# Patient Record
Sex: Female | Born: 1945 | Race: Black or African American | Hispanic: No | Smoking: Never smoker
Health system: Southern US, Community
[De-identification: ages and names within clinical notes are randomized; demographics above are authoritative.]

## PROBLEM LIST (undated history)

## (undated) DIAGNOSIS — M1990 Unspecified osteoarthritis, unspecified site: Secondary | ICD-10-CM

## (undated) DIAGNOSIS — I10 Essential (primary) hypertension: Secondary | ICD-10-CM

## (undated) DIAGNOSIS — E7800 Pure hypercholesterolemia, unspecified: Secondary | ICD-10-CM

## (undated) HISTORY — PX: ABDOMINAL HYSTERECTOMY: SHX81

---

## 2005-04-04 ENCOUNTER — Ambulatory Visit: Payer: Self-pay | Admitting: Family Medicine

## 2006-04-10 ENCOUNTER — Ambulatory Visit: Payer: Self-pay | Admitting: Family Medicine

## 2006-05-19 ENCOUNTER — Ambulatory Visit: Payer: Self-pay | Admitting: Gastroenterology

## 2006-12-21 ENCOUNTER — Emergency Department: Payer: Self-pay | Admitting: Emergency Medicine

## 2007-05-24 ENCOUNTER — Ambulatory Visit: Payer: Self-pay | Admitting: Family Medicine

## 2008-01-06 ENCOUNTER — Emergency Department: Payer: Self-pay | Admitting: Emergency Medicine

## 2008-06-11 ENCOUNTER — Ambulatory Visit: Payer: Self-pay | Admitting: Family Medicine

## 2009-06-12 ENCOUNTER — Ambulatory Visit: Payer: Self-pay | Admitting: Family Medicine

## 2010-06-16 ENCOUNTER — Ambulatory Visit: Payer: Self-pay | Admitting: Internal Medicine

## 2011-07-06 ENCOUNTER — Ambulatory Visit: Payer: Self-pay | Admitting: Internal Medicine

## 2011-11-09 ENCOUNTER — Ambulatory Visit: Payer: Self-pay | Admitting: Gastroenterology

## 2012-07-06 ENCOUNTER — Ambulatory Visit: Payer: Self-pay | Admitting: Internal Medicine

## 2013-07-08 ENCOUNTER — Ambulatory Visit: Payer: Self-pay | Admitting: Internal Medicine

## 2014-07-23 ENCOUNTER — Ambulatory Visit: Payer: Self-pay | Admitting: Internal Medicine

## 2014-07-29 ENCOUNTER — Ambulatory Visit: Payer: Self-pay | Admitting: Internal Medicine

## 2015-01-27 ENCOUNTER — Ambulatory Visit
Admit: 2015-01-27 | Disposition: A | Discharge status: Home / Self Care | Payer: Self-pay | Attending: Internal Medicine | Admitting: Internal Medicine

## 2015-07-31 ENCOUNTER — Other Ambulatory Visit: Payer: Self-pay | Admitting: Internal Medicine

## 2015-07-31 DIAGNOSIS — R928 Other abnormal and inconclusive findings on diagnostic imaging of breast: Secondary | ICD-10-CM

## 2015-08-14 ENCOUNTER — Ambulatory Visit
Admission: RE | Admit: 2015-08-14 | Discharge: 2015-08-14 | Disposition: A | Discharge status: Home / Self Care | Payer: Medicare Other | Source: Ambulatory Visit | Attending: Internal Medicine | Admitting: Internal Medicine

## 2015-08-14 DIAGNOSIS — R928 Other abnormal and inconclusive findings on diagnostic imaging of breast: Secondary | ICD-10-CM | POA: Insufficient documentation

## 2016-06-14 ENCOUNTER — Emergency Department
Admission: EM | Admit: 2016-06-14 | Discharge: 2016-06-14 | Disposition: A | Discharge status: Home / Self Care | Payer: Medicare Other | Attending: Emergency Medicine | Admitting: Emergency Medicine

## 2016-06-14 DIAGNOSIS — S46911A Strain of unspecified muscle, fascia and tendon at shoulder and upper arm level, right arm, initial encounter: Secondary | ICD-10-CM | POA: Insufficient documentation

## 2016-06-14 DIAGNOSIS — Y929 Unspecified place or not applicable: Secondary | ICD-10-CM | POA: Insufficient documentation

## 2016-06-14 DIAGNOSIS — Y990 Civilian activity done for income or pay: Secondary | ICD-10-CM | POA: Diagnosis not present

## 2016-06-14 DIAGNOSIS — X501XXA Overexertion from prolonged static or awkward postures, initial encounter: Secondary | ICD-10-CM | POA: Diagnosis not present

## 2016-06-14 DIAGNOSIS — Y9389 Activity, other specified: Secondary | ICD-10-CM | POA: Diagnosis not present

## 2016-06-14 DIAGNOSIS — T148XXA Other injury of unspecified body region, initial encounter: Secondary | ICD-10-CM

## 2016-06-14 DIAGNOSIS — S4992XA Unspecified injury of left shoulder and upper arm, initial encounter: Secondary | ICD-10-CM | POA: Diagnosis present

## 2016-06-14 MED ORDER — CYCLOBENZAPRINE HCL 10 MG PO TABS: 5.0000 mg | ORAL_TABLET | Freq: Three times a day (TID) | ORAL | 0 refills | Status: DC | PRN

## 2016-06-14 MED ORDER — NAPROXEN 500 MG PO TABS: 500.0000 mg | ORAL_TABLET | Freq: Two times a day (BID) | ORAL | 0 refills | Status: AC

## 2016-06-14 NOTE — ED Notes (Signed)
Per workers comp profile no UDS nor Breath Analysis is required.

## 2016-06-14 NOTE — ED Triage Notes (Signed)
Pt was lifting bucket at work and felt sharp pain to right lower back are, states worse when she walks or moves. Pt does not want to file workmans comp at this time.

## 2016-06-14 NOTE — ED Provider Notes (Signed)
[facility] Emergency Department Provider Note ____________________________________________  Time seen: Approximately 7:44 PM  I have reviewed the triage vital signs and the nursing notes.   HISTORY  Chief Complaint Back Pain    HPI Jennifer Wagner is a 70 y.o. female who presents to the emergency department for evaluation of pain in her right shoulder and right side. She states that while at work, she lifted a bucket and felt a sharp pain in her shoulder and side. She states she has had this pain before and was given some muscle relaxers which helped relieve the pain.  No past medical history on file.  There are no active problems to display for this patient.   No past surgical history on file.  Prior to Admission medications   Medication Sig Start Date End Date Taking? Authorizing Provider  cyclobenzaprine (FLEXERIL) 10 MG tablet Take 0.5 tablets (5 mg total) by mouth 3 (three) times daily as needed for muscle spasms. 06/14/16   Victorino Dike, FNP  naproxen (NAPROSYN) 500 MG tablet Take 1 tablet (500 mg total) by mouth 2 (two) times daily with a meal. 06/14/16   Victorino Dike, FNP    Allergies Review of patient's allergies indicates no known allergies.  No family history on file.  Social History Social History  Substance Use Topics  . Smoking status: Not on file  . Smokeless tobacco: Not on file  . Alcohol use Not on file    Review of Systems Constitutional: No recent illness. Cardiovascular: Denies chest pain or palpitations. Respiratory: Denies shortness of breath. Musculoskeletal: Pain in Right shoulder and right side Skin: Negative for rash, wound, lesion. Neurological: Negative for focal weakness or numbness.  ____________________________________________   PHYSICAL EXAM:  VITAL SIGNS: ED Triage Vitals [06/14/16 1912]  Enc Vitals Group     BP (!) 175/89     Pulse Rate (!) 106     Resp 18     Temp 98.5 F (36.9 C)      Temp Source Oral     SpO2 99 %     Weight 140 lb (63.5 kg)     Height 5\' 6"  (1.676 m)     Head Circumference      Peak Flow      Pain Score 10     Pain Loc      Pain Edu?      Excl. in [place]?     Constitutional: Alert and oriented. Well appearing and in no acute distress. Eyes: Conjunctivae are normal. EOMI. Head: Atraumatic. Neck: No stridor.  Respiratory: Normal respiratory effort.   Musculoskeletal: Range of motion of all extremities, specifically the right shoulder. Pain in the lateral thoracic area increases with stretching. Neurologic:  Normal speech and language. No gross focal neurologic deficits are appreciated. Speech is normal. No gait instability. Skin:  Skin is warm, dry and intact. Atraumatic. Psychiatric: Mood and affect are normal. Speech and behavior are normal.  ____________________________________________   LABS (all labs ordered are listed, but only abnormal results are displayed)  Labs Reviewed - No data to display ____________________________________________  RADIOLOGY  Not indicated ____________________________________________   PROCEDURES  Procedure(s) performed: None   ____________________________________________   INITIAL IMPRESSION / ASSESSMENT AND PLAN / ED COURSE  Clinical Course    Pertinent labs & imaging results that were available during my care of the patient were reviewed by me and considered in my medical decision making (see chart for details).  Patient was advised to take  one half tablet of Flexeril and Naprosyn as prescribed. She was advised that the Flexeril may potentially cause dizziness. She reports having taken it recently without any adverse effect. She was encouraged to follow up with her primary care provider for symptoms that are not improving over the week. She was also advised to return to the emergency department for symptoms that change or worsen if she's unable to schedule an  appointment. ____________________________________________   FINAL CLINICAL IMPRESSION(S) / ED DIAGNOSES  Final diagnoses:  Muscle strain       Victorino Dike, FNP 06/14/16 2037    Nena Polio, MD 06/14/16 2237

## 2016-06-14 NOTE — ED Notes (Signed)
Pt wants to file workers comp for injury. Pt provided supervisor number, Mr. Lenard Simmer [phone]). Attempted to call [phone], no answer.

## 2016-07-28 ENCOUNTER — Other Ambulatory Visit: Payer: Self-pay | Admitting: Internal Medicine

## 2016-07-28 DIAGNOSIS — Z1231 Encounter for screening mammogram for malignant neoplasm of breast: Secondary | ICD-10-CM

## 2016-08-05 ENCOUNTER — Other Ambulatory Visit: Payer: Self-pay | Admitting: Internal Medicine

## 2016-08-05 DIAGNOSIS — Z13820 Encounter for screening for osteoporosis: Secondary | ICD-10-CM

## 2016-08-30 ENCOUNTER — Ambulatory Visit
Admission: RE | Admit: 2016-08-30 | Discharge: 2016-08-30 | Disposition: A | Discharge status: Home / Self Care | Payer: Medicare Other | Source: Ambulatory Visit | Attending: Internal Medicine | Admitting: Internal Medicine

## 2016-08-30 DIAGNOSIS — Z1231 Encounter for screening mammogram for malignant neoplasm of breast: Secondary | ICD-10-CM | POA: Diagnosis not present

## 2016-09-14 ENCOUNTER — Ambulatory Visit
Admission: RE | Admit: 2016-09-14 | Discharge: 2016-09-14 | Disposition: A | Discharge status: Home / Self Care | Payer: Medicare Other | Source: Ambulatory Visit | Attending: Internal Medicine | Admitting: Internal Medicine

## 2016-09-14 DIAGNOSIS — Z780 Asymptomatic menopausal state: Secondary | ICD-10-CM | POA: Diagnosis not present

## 2016-09-14 DIAGNOSIS — M85851 Other specified disorders of bone density and structure, right thigh: Secondary | ICD-10-CM | POA: Diagnosis not present

## 2016-09-14 DIAGNOSIS — Z13820 Encounter for screening for osteoporosis: Secondary | ICD-10-CM | POA: Insufficient documentation

## 2017-06-02 ENCOUNTER — Encounter: Payer: Self-pay | Admitting: *Deleted

## 2017-06-05 ENCOUNTER — Encounter
Admission: RE | Disposition: A | Discharge status: Home / Self Care | Payer: Self-pay | Source: Ambulatory Visit | Attending: Gastroenterology

## 2017-06-05 ENCOUNTER — Ambulatory Visit: Payer: Medicare Other | Admitting: Anesthesiology

## 2017-06-05 ENCOUNTER — Ambulatory Visit
Admission: RE | Admit: 2017-06-05 | Discharge: 2017-06-05 | Disposition: A | Discharge status: Home / Self Care | Payer: Medicare Other | Source: Ambulatory Visit | Attending: Gastroenterology | Admitting: Gastroenterology

## 2017-06-05 ENCOUNTER — Encounter: Payer: Self-pay | Admitting: *Deleted

## 2017-06-05 DIAGNOSIS — I10 Essential (primary) hypertension: Secondary | ICD-10-CM | POA: Insufficient documentation

## 2017-06-05 DIAGNOSIS — K635 Polyp of colon: Secondary | ICD-10-CM | POA: Insufficient documentation

## 2017-06-05 DIAGNOSIS — D125 Benign neoplasm of sigmoid colon: Secondary | ICD-10-CM | POA: Insufficient documentation

## 2017-06-05 DIAGNOSIS — Z1211 Encounter for screening for malignant neoplasm of colon: Secondary | ICD-10-CM | POA: Insufficient documentation

## 2017-06-05 DIAGNOSIS — D123 Benign neoplasm of transverse colon: Secondary | ICD-10-CM | POA: Diagnosis not present

## 2017-06-05 DIAGNOSIS — K5730 Diverticulosis of large intestine without perforation or abscess without bleeding: Secondary | ICD-10-CM | POA: Insufficient documentation

## 2017-06-05 DIAGNOSIS — M1990 Unspecified osteoarthritis, unspecified site: Secondary | ICD-10-CM | POA: Diagnosis not present

## 2017-06-05 DIAGNOSIS — E7800 Pure hypercholesterolemia, unspecified: Secondary | ICD-10-CM | POA: Insufficient documentation

## 2017-06-05 DIAGNOSIS — Z791 Long term (current) use of non-steroidal anti-inflammatories (NSAID): Secondary | ICD-10-CM | POA: Insufficient documentation

## 2017-06-05 DIAGNOSIS — Z79899 Other long term (current) drug therapy: Secondary | ICD-10-CM | POA: Diagnosis not present

## 2017-06-05 DIAGNOSIS — Z800 Family history of malignant neoplasm of digestive organs: Secondary | ICD-10-CM | POA: Insufficient documentation

## 2017-06-05 DIAGNOSIS — K640 First degree hemorrhoids: Secondary | ICD-10-CM | POA: Insufficient documentation

## 2017-06-05 HISTORY — DX: Unspecified osteoarthritis, unspecified site: M19.90

## 2017-06-05 HISTORY — DX: Essential (primary) hypertension: I10

## 2017-06-05 HISTORY — DX: Pure hypercholesterolemia, unspecified: E78.00

## 2017-06-05 HISTORY — PX: COLONOSCOPY WITH PROPOFOL: SHX5780

## 2017-06-05 SURGERY — COLONOSCOPY WITH PROPOFOL
Anesthesia: General

## 2017-06-05 MED ORDER — SODIUM CHLORIDE 0.9 % IV SOLN: INTRAVENOUS | Status: DC

## 2017-06-05 MED ORDER — SODIUM CHLORIDE 0.9 % IV SOLN
INTRAVENOUS | Status: DC
  Administered 2017-06-05: 11:00:00 via INTRAVENOUS

## 2017-06-05 MED ORDER — PHENYLEPHRINE HCL 10 MG/ML IJ SOLN
INTRAMUSCULAR | Status: DC | PRN
  Administered 2017-06-05: 100 ug via INTRAVENOUS

## 2017-06-05 MED ORDER — PROPOFOL 500 MG/50ML IV EMUL
INTRAVENOUS | Status: DC | PRN
  Administered 2017-06-05: 140 ug/kg/min via INTRAVENOUS

## 2017-06-05 MED ORDER — PROPOFOL 10 MG/ML IV BOLUS
INTRAVENOUS | Status: DC | PRN
  Administered 2017-06-05: 20 mg via INTRAVENOUS
  Administered 2017-06-05: 50 mg via INTRAVENOUS

## 2017-06-05 MED ORDER — PROPOFOL 500 MG/50ML IV EMUL
INTRAVENOUS | Status: AC
  Filled 2017-06-05: qty 50

## 2017-06-05 NOTE — Anesthesia Procedure Notes (Signed)
Date/Time: 06/05/2017 10:53 AM Performed by: Nelda Marseille Pre-anesthesia Checklist: Patient identified, Emergency Drugs available, Suction available, Patient being monitored and Timeout performed Oxygen Delivery Method: Nasal cannula

## 2017-06-05 NOTE — Anesthesia Postprocedure Evaluation (Signed)
Anesthesia Post Note  Patient: Jennifer Wagner  Procedure(s) Performed: Procedure(s) (LRB): COLONOSCOPY WITH PROPOFOL (N/A)  Patient location during evaluation: Endoscopy Anesthesia Type: General Level of consciousness: awake and alert Pain management: pain level controlled Vital Signs Assessment: post-procedure vital signs reviewed and stable Respiratory status: spontaneous breathing, nonlabored ventilation, respiratory function stable and patient connected to nasal cannula oxygen Cardiovascular status: blood pressure returned to baseline and stable Postop Assessment: no signs of nausea or vomiting Anesthetic complications: no     Last Vitals:  Vitals:   06/05/17 1131 06/05/17 1141  BP: (!) 103/49 (!) 116/57  Pulse: (!) 58 (!) 57  Resp: 20 17  Temp: (!) 36.1 C   SpO2: 100% 100%    Last Pain:  Vitals:   06/05/17 1131  TempSrc: Tympanic                 Martha Clan

## 2017-06-05 NOTE — Anesthesia Preprocedure Evaluation (Signed)
Anesthesia Evaluation  Patient identified by MRN, date of birth, ID band Patient awake    Reviewed: Allergy & Precautions, H&P , NPO status , Patient's Chart, lab work & pertinent test results, reviewed documented beta blocker date and time   History of Anesthesia Complications Negative for: history of anesthetic complications  Airway Mallampati: II  TM Distance: >3 FB Neck ROM: full    Dental  (+) Poor Dentition, Dental Advidsory Given   Pulmonary neg pulmonary ROS,           Cardiovascular Exercise Tolerance: Good hypertension, (-) angina(-) CAD, (-) Past MI, (-) Cardiac Stents and (-) CABG (-) dysrhythmias (-) Valvular Problems/Murmurs     Neuro/Psych negative neurological ROS  negative psych ROS   GI/Hepatic negative GI ROS, Neg liver ROS,   Endo/Other  negative endocrine ROS  Renal/GU negative Renal ROS  negative genitourinary   Musculoskeletal   Abdominal   Peds  Hematology negative hematology ROS (+)   Anesthesia Other Findings Past Medical History: No date: Arthritis No date: High cholesterol No date: Hypertension   Reproductive/Obstetrics negative OB ROS                             Anesthesia Physical Anesthesia Plan  ASA: II  Anesthesia Plan: General   Post-op Pain Management:    Induction: Intravenous  PONV Risk Score and Plan: 3 and Propofol infusion  Airway Management Planned: Natural Airway and Nasal Cannula  Additional Equipment:   Intra-op Plan:   Post-operative Plan:   Informed Consent: I have reviewed the patients History and Physical, chart, labs and discussed the procedure including the risks, benefits and alternatives for the proposed anesthesia with the patient or authorized representative who has indicated his/her understanding and acceptance.   Dental Advisory Given  Plan Discussed with: Anesthesiologist, CRNA and Surgeon  Anesthesia Plan  Comments:         Anesthesia Quick Evaluation

## 2017-06-05 NOTE — Transfer of Care (Signed)
Immediate Anesthesia Transfer of Care Note  Patient: Jennifer Wagner  Procedure(s) Performed: Procedure(s): COLONOSCOPY WITH PROPOFOL (N/A)  Patient Location: PACU  Anesthesia Type:General  Level of Consciousness: sedated  Airway & Oxygen Therapy: Patient Spontanous Breathing and Patient connected to nasal cannula oxygen  Post-op Assessment: Report given to RN and Post -op Vital signs reviewed and stable  Post vital signs: Reviewed and stable  Last Vitals:  Vitals:   06/05/17 1005  BP: (!) 182/84  Pulse: (!) 55  Resp: 16  Temp: (!) 36.2 C    Last Pain:  Vitals:   06/05/17 1005  TempSrc: Tympanic         Complications: No apparent anesthesia complications

## 2017-06-05 NOTE — Op Note (Signed)
[facility] Patient Name: Jennifer Wagner Procedure Date: 06/05/2017 10:32 AM MRN: [id number] Account #: [id number] Date of Birth: 04/15/1946 Admit Type: Outpatient Age: 71 Room: [facility] ENDO ROOM 1 Gender: Female Note Status: Finalized Procedure:            Colonoscopy Indications:          Family history of colon cancer in a first-degree                        relative Providers:            Lollie Sails, MD Referring MD:         No Local Md, MD (Referring MD) Medicines:            Monitored Anesthesia Care Complications:        No immediate complications. Procedure:            Pre-Anesthesia Assessment:                       - ASA Grade Assessment: II - A patient with mild                        systemic disease.                       After obtaining informed consent, the colonoscope was                        passed under direct vision. Throughout the procedure,                        the patient's blood pressure, pulse, and oxygen                        saturations were monitored continuously. The Olympus                        PCF-H180AL colonoscope ( S#: [id number] ) was introduced                        through the anus and advanced to the the cecum,                        identified by appendiceal orifice and ileocecal valve.                        The colonoscopy was performed with moderate difficulty                        due to a tortuous colon. Successful completion of the                        procedure was aided by changing the patient to a supine                        position, changing the patient to a prone position and                        using manual pressure. The patient tolerated the  procedure well. The quality of the bowel preparation                        was good. Findings:      A 11 mm polyp was found in the proximal transverse colon. The polyp was       sessile. The polyp was removed with a  cold snare. Resection and       retrieval were complete.      A 3 mm polyp was found in the descending colon. The polyp was sessile.       The polyp was removed with a cold biopsy forceps. Resection and       retrieval were complete.      A 4 mm polyp was found in the sigmoid colon. The polyp was       semi-pedunculated. The polyp was removed with a cold snare. Resection       and retrieval were complete.      Multiple medium-mouthed diverticula were found in the sigmoid colon and       descending colon.      Non-bleeding internal hemorrhoids were found during retroflexion. The       hemorrhoids were small and Grade I (internal hemorrhoids that do not       prolapse).      The digital rectal exam was normal. Impression:           - One 11 mm polyp in the proximal transverse colon,                        removed with a cold snare. Resected and retrieved.                       - One 3 mm polyp in the descending colon, removed with                        a cold biopsy forceps. Resected and retrieved.                       - One 4 mm polyp in the sigmoid colon, removed with a                        cold snare. Resected and retrieved.                       - Diverticulosis in the sigmoid colon and in the                        descending colon.                       - Non-bleeding internal hemorrhoids. Recommendation:       - Soft diet today, then advance as tolerated to advance                        diet as tolerated. Procedure Code(s):    --- Professional ---                       [phone], Colonoscopy, flexible; with removal of tumor(s),  polyp(s), or other lesion(s) by snare technique                       45380, 59, Colonoscopy, flexible; with biopsy, single                        or multiple Diagnosis Code(s):    --- Professional ---                       D12.3, Benign neoplasm of transverse colon (hepatic                        flexure or splenic flexure)                        D12.4, Benign neoplasm of descending colon                       D12.5, Benign neoplasm of sigmoid colon                       K64.0, First degree hemorrhoids                       Z80.0, Family history of malignant neoplasm of                        digestive organs                       K57.30, Diverticulosis of large intestine without                        perforation or abscess without bleeding CPT copyright 2016 American [employer]. All rights reserved. The codes documented in this report are preliminary and upon coder review may  be revised to meet current compliance requirements. Lollie Sails, MD 06/05/2017 11:29:29 AM This report has been signed electronically. Number of Addenda: 0 Note Initiated On: 06/05/2017 10:32 AM Scope Withdrawal Time: 0 hours 10 minutes 51 seconds  Total Procedure Duration: 0 hours 34 minutes 20 seconds       [facility]

## 2017-06-05 NOTE — Anesthesia Post-op Follow-up Note (Signed)
Anesthesia QCDR form completed.        

## 2017-06-05 NOTE — H&P (Signed)
Outpatient short stay form Pre-procedure 06/05/2017 10:31 AM Jennifer Sails MD  Primary Physician: Dr. Ellin Goodie  Reason for visit:  Colonoscopy  History of present illness:  Patient is a 71 year old female presenting today as above. There is a family history of siblings with cancer thought to be colon cancer. She tolerated her prep well. She takes no aspirin or blood thinning agents. She had a colonoscopy 11/09/2011 showing multiple small diverticuli negative otherwise.    Current Facility-Administered Medications:  .  0.9 %  sodium chloride infusion, , Intravenous, Continuous, Jennifer Sails, MD .  0.9 %  sodium chloride infusion, , Intravenous, Continuous, Jennifer Sails, MD  Prescriptions Prior to Admission  Medication Sig Dispense Refill Last Dose  . amLODipine (NORVASC) 5 MG tablet Take 5 mg by mouth daily.   06/04/2017 at 2100  . atenolol (TENORMIN) 50 MG tablet Take 50 mg by mouth daily.   06/04/2017 at 2100  . naproxen (NAPROSYN) 500 MG tablet Take 1 tablet (500 mg total) by mouth 2 (two) times daily with a meal. 30 tablet 0 Past Week at Unknown time     No Known Allergies   Past Medical History:  Diagnosis Date  . Arthritis   . High cholesterol   . Hypertension     Review of systems:      Physical Exam    Heart and lungs: Regular rate and rhythm without rub or gallop, lungs are bilaterally clear.    HEENT: Normocephalic atraumatic eyes are anicteric    Other:     Pertinant exam for procedure: Soft nontender nondistended bowel sounds positive normoactive.    Planned proceedures: Colonoscopy and indicated procedures. I have discussed the risks benefits and complications of procedures to include not limited to bleeding, infection, perforation and the risk of sedation and the patient wishes to proceed.    Jennifer Sails, MD Gastroenterology 06/05/2017  10:31 AM

## 2017-06-06 ENCOUNTER — Encounter: Payer: Self-pay | Admitting: Gastroenterology

## 2017-06-06 LAB — SURGICAL PATHOLOGY

## 2017-07-28 ENCOUNTER — Other Ambulatory Visit: Payer: Self-pay | Admitting: Internal Medicine

## 2017-07-28 DIAGNOSIS — Z1231 Encounter for screening mammogram for malignant neoplasm of breast: Secondary | ICD-10-CM

## 2017-09-01 ENCOUNTER — Ambulatory Visit
Admission: RE | Admit: 2017-09-01 | Discharge: 2017-09-01 | Disposition: A | Discharge status: Home / Self Care | Payer: Medicare Other | Source: Ambulatory Visit | Attending: Internal Medicine | Admitting: Internal Medicine

## 2017-09-01 DIAGNOSIS — Z1231 Encounter for screening mammogram for malignant neoplasm of breast: Secondary | ICD-10-CM | POA: Diagnosis present

## 2017-09-11 ENCOUNTER — Other Ambulatory Visit: Payer: Self-pay | Admitting: Internal Medicine

## 2017-09-11 DIAGNOSIS — N6310 Unspecified lump in the right breast, unspecified quadrant: Secondary | ICD-10-CM

## 2017-09-11 DIAGNOSIS — R928 Other abnormal and inconclusive findings on diagnostic imaging of breast: Secondary | ICD-10-CM

## 2017-09-20 ENCOUNTER — Ambulatory Visit
Admission: RE | Admit: 2017-09-20 | Discharge: 2017-09-20 | Disposition: A | Discharge status: Home / Self Care | Payer: Medicare Other | Source: Ambulatory Visit | Attending: Internal Medicine | Admitting: Internal Medicine

## 2017-09-20 DIAGNOSIS — N6310 Unspecified lump in the right breast, unspecified quadrant: Secondary | ICD-10-CM

## 2017-09-20 DIAGNOSIS — N6081 Other benign mammary dysplasias of right breast: Secondary | ICD-10-CM | POA: Diagnosis not present

## 2017-09-20 DIAGNOSIS — R928 Other abnormal and inconclusive findings on diagnostic imaging of breast: Secondary | ICD-10-CM

## 2018-08-13 ENCOUNTER — Other Ambulatory Visit: Payer: Self-pay | Admitting: Internal Medicine

## 2018-08-13 DIAGNOSIS — Z1231 Encounter for screening mammogram for malignant neoplasm of breast: Secondary | ICD-10-CM

## 2018-09-04 ENCOUNTER — Ambulatory Visit
Admission: RE | Admit: 2018-09-04 | Discharge: 2018-09-04 | Disposition: A | Discharge status: Home / Self Care | Payer: Medicare Other | Source: Ambulatory Visit | Attending: Internal Medicine | Admitting: Internal Medicine

## 2018-09-04 DIAGNOSIS — Z1231 Encounter for screening mammogram for malignant neoplasm of breast: Secondary | ICD-10-CM

## 2018-10-08 ENCOUNTER — Other Ambulatory Visit: Payer: Self-pay | Admitting: Internal Medicine

## 2018-10-08 DIAGNOSIS — M8580 Other specified disorders of bone density and structure, unspecified site: Secondary | ICD-10-CM

## 2018-11-22 ENCOUNTER — Ambulatory Visit
Admission: RE | Admit: 2018-11-22 | Discharge: 2018-11-22 | Disposition: A | Discharge status: Home / Self Care | Payer: Medicare Other | Source: Ambulatory Visit | Attending: Internal Medicine | Admitting: Internal Medicine

## 2018-11-22 DIAGNOSIS — M85852 Other specified disorders of bone density and structure, left thigh: Secondary | ICD-10-CM | POA: Diagnosis not present

## 2018-11-22 DIAGNOSIS — M85832 Other specified disorders of bone density and structure, left forearm: Secondary | ICD-10-CM | POA: Diagnosis not present

## 2018-11-22 DIAGNOSIS — M8580 Other specified disorders of bone density and structure, unspecified site: Secondary | ICD-10-CM | POA: Diagnosis present

## 2019-07-26 ENCOUNTER — Other Ambulatory Visit: Payer: Self-pay | Admitting: Internal Medicine

## 2019-07-26 DIAGNOSIS — Z1231 Encounter for screening mammogram for malignant neoplasm of breast: Secondary | ICD-10-CM

## 2019-09-06 ENCOUNTER — Ambulatory Visit
Admission: RE | Admit: 2019-09-06 | Discharge: 2019-09-06 | Disposition: A | Discharge status: Home / Self Care | Payer: Medicare Other | Source: Ambulatory Visit | Attending: Internal Medicine | Admitting: Internal Medicine

## 2019-09-06 DIAGNOSIS — Z1231 Encounter for screening mammogram for malignant neoplasm of breast: Secondary | ICD-10-CM | POA: Insufficient documentation

## 2019-11-21 ENCOUNTER — Emergency Department: Payer: Medicare PPO

## 2019-11-21 ENCOUNTER — Encounter: Payer: Self-pay | Admitting: Emergency Medicine

## 2019-11-21 ENCOUNTER — Other Ambulatory Visit: Payer: Self-pay

## 2019-11-21 ENCOUNTER — Emergency Department
Admission: EM | Admit: 2019-11-21 | Discharge: 2019-11-21 | Disposition: A | Discharge status: Home / Self Care | Payer: Medicare PPO | Attending: Emergency Medicine | Admitting: Emergency Medicine

## 2019-11-21 DIAGNOSIS — M25512 Pain in left shoulder: Secondary | ICD-10-CM | POA: Diagnosis not present

## 2019-11-21 DIAGNOSIS — Z87891 Personal history of nicotine dependence: Secondary | ICD-10-CM | POA: Insufficient documentation

## 2019-11-21 DIAGNOSIS — I10 Essential (primary) hypertension: Secondary | ICD-10-CM | POA: Insufficient documentation

## 2019-11-21 MED ORDER — LIDOCAINE 5 % EX PTCH: 1.0000 | MEDICATED_PATCH | CUTANEOUS | 0 refills | Status: AC

## 2019-11-21 NOTE — ED Notes (Signed)
Pt states having left shoulder pain and that it feels 'sore'. Pt is able to lift arm normally and has good grip strength. Pt states her shoulder is sore upon palpation.
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
## 2020-08-03 ENCOUNTER — Other Ambulatory Visit: Payer: Self-pay | Admitting: Internal Medicine

## 2020-08-03 DIAGNOSIS — Z1231 Encounter for screening mammogram for malignant neoplasm of breast: Secondary | ICD-10-CM

## 2020-09-08 ENCOUNTER — Ambulatory Visit
Admission: RE | Admit: 2020-09-08 | Discharge: 2020-09-08 | Disposition: A | Discharge status: Home / Self Care | Payer: Medicare PPO | Source: Ambulatory Visit | Attending: Internal Medicine | Admitting: Internal Medicine

## 2020-09-08 ENCOUNTER — Other Ambulatory Visit: Payer: Self-pay

## 2020-09-08 DIAGNOSIS — Z1231 Encounter for screening mammogram for malignant neoplasm of breast: Secondary | ICD-10-CM | POA: Insufficient documentation

## 2020-09-28 ENCOUNTER — Other Ambulatory Visit: Admission: RE | Admit: 2020-09-28 | Payer: Medicare PPO | Source: Ambulatory Visit

## 2020-09-30 ENCOUNTER — Other Ambulatory Visit
Admission: RE | Admit: 2020-09-30 | Discharge: 2020-09-30 | Disposition: A | Discharge status: Home / Self Care | Payer: Medicare PPO | Source: Ambulatory Visit | Attending: Gastroenterology | Admitting: Gastroenterology

## 2020-09-30 DIAGNOSIS — Z01812 Encounter for preprocedural laboratory examination: Secondary | ICD-10-CM | POA: Insufficient documentation

## 2020-09-30 DIAGNOSIS — Z20822 Contact with and (suspected) exposure to covid-19: Secondary | ICD-10-CM | POA: Insufficient documentation

## 2020-09-30 LAB — SARS CORONAVIRUS 2 (TAT 6-24 HRS): SARS Coronavirus 2: NEGATIVE

## 2020-10-02 ENCOUNTER — Encounter
Admission: RE | Disposition: A | Discharge status: Home / Self Care | Payer: Self-pay | Source: Home / Self Care | Attending: Gastroenterology

## 2020-10-02 ENCOUNTER — Ambulatory Visit: Payer: Medicare PPO | Admitting: Anesthesiology

## 2020-10-02 ENCOUNTER — Encounter: Payer: Self-pay | Admitting: *Deleted

## 2020-10-02 ENCOUNTER — Ambulatory Visit
Admission: RE | Admit: 2020-10-02 | Discharge: 2020-10-02 | Disposition: A | Discharge status: Home / Self Care | Payer: Medicare PPO | Attending: Gastroenterology | Admitting: Gastroenterology

## 2020-10-02 ENCOUNTER — Other Ambulatory Visit: Payer: Self-pay

## 2020-10-02 DIAGNOSIS — Z86010 Personal history of colonic polyps: Secondary | ICD-10-CM | POA: Insufficient documentation

## 2020-10-02 DIAGNOSIS — Z791 Long term (current) use of non-steroidal anti-inflammatories (NSAID): Secondary | ICD-10-CM | POA: Diagnosis not present

## 2020-10-02 DIAGNOSIS — D123 Benign neoplasm of transverse colon: Secondary | ICD-10-CM | POA: Diagnosis not present

## 2020-10-02 DIAGNOSIS — K640 First degree hemorrhoids: Secondary | ICD-10-CM | POA: Diagnosis not present

## 2020-10-02 DIAGNOSIS — Z800 Family history of malignant neoplasm of digestive organs: Secondary | ICD-10-CM | POA: Insufficient documentation

## 2020-10-02 DIAGNOSIS — Z09 Encounter for follow-up examination after completed treatment for conditions other than malignant neoplasm: Secondary | ICD-10-CM | POA: Diagnosis present

## 2020-10-02 DIAGNOSIS — K5730 Diverticulosis of large intestine without perforation or abscess without bleeding: Secondary | ICD-10-CM | POA: Diagnosis not present

## 2020-10-02 DIAGNOSIS — Z79899 Other long term (current) drug therapy: Secondary | ICD-10-CM | POA: Diagnosis not present

## 2020-10-02 HISTORY — PX: COLONOSCOPY WITH PROPOFOL: SHX5780

## 2020-10-02 SURGERY — COLONOSCOPY WITH PROPOFOL
Anesthesia: General

## 2020-10-02 MED ORDER — PROPOFOL 10 MG/ML IV BOLUS
INTRAVENOUS | Status: DC | PRN
  Administered 2020-10-02: 50 mg via INTRAVENOUS
  Administered 2020-10-02: 20 mg via INTRAVENOUS

## 2020-10-02 MED ORDER — PROPOFOL 500 MG/50ML IV EMUL
INTRAVENOUS | Status: DC | PRN
  Administered 2020-10-02: 150 ug/kg/min via INTRAVENOUS

## 2020-10-02 MED ORDER — SODIUM CHLORIDE 0.9 % IV SOLN: INTRAVENOUS | Status: DC

## 2020-10-02 NOTE — Op Note (Signed)
[facility] Gastroenterology Patient Name: Jennifer Wagner Procedure Date: 10/02/2020 1:38 PM MRN: [id number] Account #: [id number] Date of Birth: [birth year] Admit Type: Outpatient Age: 74 Room: [facility] ENDO ROOM 3 Gender: Female Note Status: Finalized Procedure:             Colonoscopy Indications:           High risk colon cancer surveillance: Personal history                         of adenoma (10 mm or greater in size) Providers:             Andrey Farmer MD, MD Referring MD:          Remus Blake MD, MD (Referring MD) Medicines:             Monitored Anesthesia Care Complications:         No immediate complications. Estimated blood loss:                         Minimal. Procedure:             Pre-Anesthesia Assessment:                        - Prior to the procedure, a History and Physical was                         performed, and patient medications and allergies were                         reviewed. The patient is competent. The risks and                         benefits of the procedure and the sedation options and                         risks were discussed with the patient. All questions                         were answered and informed consent was obtained.                         Patient identification and proposed procedure were                         verified by the physician, the nurse, the anesthetist                         and the technician in the endoscopy suite. Mental                         Status Examination: alert and oriented. Airway                         Examination: normal oropharyngeal airway and neck                         mobility. Respiratory Examination: clear to  auscultation. CV Examination: normal. Prophylactic                         Antibiotics: The patient does not require prophylactic                         antibiotics. Prior Anticoagulants: The patient has                         taken no  previous anticoagulant or antiplatelet                         agents. ASA Grade Assessment: II - A patient with mild                         systemic disease. After reviewing the risks and                         benefits, the patient was deemed in satisfactory                         condition to undergo the procedure. The anesthesia                         plan was to use monitored anesthesia care (MAC).                         Immediately prior to administration of medications,                         the patient was re-assessed for adequacy to receive                         sedatives. The heart rate, respiratory rate, oxygen                         saturations, blood pressure, adequacy of pulmonary                         ventilation, and response to care were monitored                         throughout the procedure. The physical status of the                         patient was re-assessed after the procedure.                        After obtaining informed consent, the colonoscope was                         passed under direct vision. Throughout the procedure,                         the patient's blood pressure, pulse, and oxygen                         saturations were monitored continuously. The  Colonoscope was introduced through the anus and                         advanced to the the cecum, identified by appendiceal                         orifice and ileocecal valve. The patient tolerated the                         procedure well. The colonoscopy was somewhat difficult                         due to restricted mobility of the colon. Successful                         completion of the procedure was aided by withdrawing                         the scope and replacing with the pediatric                         colonoscope. The quality of the bowel preparation was                         good. Findings:      The perianal and digital rectal  examinations were normal.      A 2 mm polyp was found in the hepatic flexure. The polyp was sessile.       The polyp was removed with a jumbo cold forceps. Resection and retrieval       were complete. Estimated blood loss was minimal.      Non-bleeding internal hemorrhoids were found during retroflexion. The       hemorrhoids were Grade I (internal hemorrhoids that do not prolapse).      A few small-mouthed diverticula were found in the sigmoid colon,       descending colon and ascending colon.      The exam was otherwise without abnormality on direct and retroflexion       views. Impression:            - One 2 mm polyp at the hepatic flexure, removed with                         a jumbo cold forceps. Resected and retrieved.                        - Non-bleeding internal hemorrhoids.                        - Diverticulosis in the sigmoid colon, in the                         descending colon and in the ascending colon.                        - The examination was otherwise normal on direct and                         retroflexion views. Recommendation:        -  Discharge patient to home.                        - Resume previous diet.                        - Continue present medications.                        - Await pathology results.                        - Repeat colonoscopy is not recommended due to current                         age (81 years or older) for surveillance.                        - Return to referring physician as previously                         scheduled. Procedure Code(s):     --- Professional ---                        [phone], Colonoscopy, flexible; with biopsy, single or                         multiple Diagnosis Code(s):     --- Professional ---                        Z86.010, Personal history of colonic polyps                        K63.5, Polyp of colon                        K64.0, First degree hemorrhoids                        K57.30, Diverticulosis of  large intestine without                         perforation or abscess without bleeding CPT copyright 2019 American Medical Association. All rights reserved. The codes documented in this report are preliminary and upon coder review may  be revised to meet current compliance requirements. Andrey Farmer, MD Andrey Farmer MD, MD 10/02/2020 2:15:30 PM Number of Addenda: 0 Note Initiated On: 10/02/2020 1:38 PM Scope Withdrawal Time: 0 hours 9 minutes 30 seconds  Total Procedure Duration: 0 hours 24 minutes 24 seconds  Estimated Blood Loss:  Estimated blood loss was minimal.      [facility]

## 2020-10-02 NOTE — H&P (Signed)
Outpatient short stay form Pre-procedure 10/02/2020 1:35 PM Jennifer Miyamoto MD, MPH  Primary Physician: Dr. Quay Burow  Reason for visit:  Surveillance Colonoscopy  History of present illness:   74 y/o lady with history of 1 cm polyp here for surveillance colonoscopy. Hx of hysterectomy. No blood thinners. Family history of gastric cancer in her sister that she thinks. No new GI symptoms.    Current Facility-Administered Medications:  .  0.9 %  sodium chloride infusion, , Intravenous, Continuous, Brittanya Winburn, Hilton Cork, MD, Last Rate: 20 mL/hr at 10/02/20 1334, New Bag at 10/02/20 1334  Medications Prior to Admission  Medication Sig Dispense Refill Last Dose  . amLODipine (NORVASC) 5 MG tablet Take 5 mg by mouth daily.   09/30/2020  . atenolol (TENORMIN) 50 MG tablet Take 50 mg by mouth daily.   09/30/2020  . lidocaine (LIDODERM) 5 % Place 1 patch onto the skin daily. Remove & Discard patch within 12 hours or as directed by MD 30 patch 0   . naproxen (NAPROSYN) 500 MG tablet Take 1 tablet (500 mg total) by mouth 2 (two) times daily with a meal. 30 tablet 0 09/30/2020     No Known Allergies   Past Medical History:  Diagnosis Date  . Arthritis   . High cholesterol   . Hypertension     Review of systems:  Otherwise negative.    Physical Exam  Gen: Alert, oriented. Appears stated age.  HEENT: PERRLA. Lungs: No respiratory distress CV: RRR Abd: soft, benign, no masses Ext: No edema    Planned procedures: Proceed with colonoscopy. The patient understands the nature of the planned procedure, indications, risks, alternatives and potential complications including but not limited to bleeding, infection, perforation, damage to internal organs and possible oversedation/side effects from anesthesia. The patient agrees and gives consent to proceed.  Please refer to procedure notes for findings, recommendations and patient disposition/instructions.     Jennifer Miyamoto MD,  [facility] 10/02/2020  1:35 PM

## 2020-10-02 NOTE — Interval H&P Note (Signed)
History and Physical Interval Note:  10/02/2020 1:37 PM  Jennifer Wagner  has presented today for surgery, with the diagnosis of hx adenomatous polyps.  The various methods of treatment have been discussed with the patient and family. After consideration of risks, benefits and other options for treatment, the patient has consented to  Procedure(s): COLONOSCOPY WITH PROPOFOL (N/A) as a surgical intervention.  The patient's history has been reviewed, patient examined, no change in status, stable for surgery.  I have reviewed the patient's chart and labs.  Questions were answered to the patient's satisfaction.     [name]  Ok to proceed with colonoscopy

## 2020-10-02 NOTE — Transfer of Care (Signed)
Immediate Anesthesia Transfer of Care Note  Patient: Jennifer Wagner  Procedure(s) Performed: COLONOSCOPY WITH PROPOFOL (N/A )  Patient Location: PACU  Anesthesia Type:General  Level of Consciousness: sedated  Airway & Oxygen Therapy: Patient Spontanous Breathing and Patient connected to nasal cannula oxygen  Post-op Assessment: Report given to RN and Post -op Vital signs reviewed and stable  Post vital signs: Reviewed and stable  Last Vitals:  Vitals Value Taken Time  BP 128/65 10/02/20 1417  Temp    Pulse 72 10/02/20 1417  Resp 22 10/02/20 1417  SpO2 99 % 10/02/20 1417    Last Pain:  Vitals:   10/02/20 1417  TempSrc:   PainSc: Asleep         Complications: No complications documented.

## 2020-10-02 NOTE — Anesthesia Preprocedure Evaluation (Signed)
Anesthesia Evaluation  Patient identified by MRN, date of birth, ID band Patient awake    Reviewed: Allergy & Precautions, H&P , NPO status , Patient's Chart, lab work & pertinent test results, reviewed documented beta blocker date and time   History of Anesthesia Complications Negative for: history of anesthetic complications  Airway Mallampati: II  TM Distance: >3 FB Neck ROM: full    Dental  (+) Edentulous Upper, Edentulous Lower   Pulmonary neg pulmonary ROS, neg sleep apnea, neg COPD, Patient abstained from smoking.Not current smoker,    Pulmonary exam normal breath sounds clear to auscultation       Cardiovascular Exercise Tolerance: Good METShypertension, (-) angina(-) CAD, (-) Past MI, (-) Cardiac Stents and (-) CABG (-) dysrhythmias (-) Valvular Problems/Murmurs Rhythm:Regular Rate:Normal - Systolic murmurs    Neuro/Psych negative neurological ROS  negative psych ROS   GI/Hepatic negative GI ROS, Neg liver ROS, neg GERD  ,  Endo/Other  negative endocrine ROSneg diabetes  Renal/GU negative Renal ROS  negative genitourinary   Musculoskeletal   Abdominal   Peds  Hematology negative hematology ROS (+)   Anesthesia Other Findings Past Medical History: No date: Arthritis No date: High cholesterol No date: Hypertension   Reproductive/Obstetrics negative OB ROS                             Anesthesia Physical  Anesthesia Plan  ASA: II  Anesthesia Plan: General   Post-op Pain Management:    Induction: Intravenous  PONV Risk Score and Plan: 3 and Propofol infusion  Airway Management Planned: Natural Airway and Nasal Cannula  Additional Equipment: None  Intra-op Plan:   Post-operative Plan:   Informed Consent: I have reviewed the patients History and Physical, chart, labs and discussed the procedure including the risks, benefits and alternatives for the proposed anesthesia  with the patient or authorized representative who has indicated his/her understanding and acceptance.     Dental Advisory Given  Plan Discussed with: Anesthesiologist, CRNA and Surgeon  Anesthesia Plan Comments: (Discussed risks of anesthesia with patient, including possibility of difficulty with spontaneous ventilation under anesthesia necessitating airway intervention, PONV, and rare risks such as cardiac or respiratory or neurological events. Patient understands.)        Anesthesia Quick Evaluation

## 2020-10-02 NOTE — Anesthesia Postprocedure Evaluation (Signed)
Anesthesia Post Note  Patient: Jennifer Wagner  Procedure(s) Performed: COLONOSCOPY WITH PROPOFOL (N/A )  Patient location during evaluation: Endoscopy Anesthesia Type: General Level of consciousness: awake and alert Pain management: pain level controlled Vital Signs Assessment: post-procedure vital signs reviewed and stable Respiratory status: spontaneous breathing, nonlabored ventilation, respiratory function stable and patient connected to nasal cannula oxygen Cardiovascular status: blood pressure returned to baseline and stable Postop Assessment: no apparent nausea or vomiting Anesthetic complications: no   No complications documented.   Last Vitals:  Vitals:   10/02/20 1427 10/02/20 1437  BP: 125/73 (!) 145/84  Pulse: 76 76  Resp: 13 14  Temp:    SpO2: 100% 100%    Last Pain:  Vitals:   10/02/20 1437  TempSrc:   PainSc: 0-No pain                 Arita Miss

## 2020-10-02 NOTE — Anesthesia Procedure Notes (Signed)
Date/Time: 10/02/2020 1:46 PM Performed by: Nelda Marseille, CRNA Pre-anesthesia Checklist: Patient identified, Emergency Drugs available, Suction available, Patient being monitored and Timeout performed Oxygen Delivery Method: Nasal cannula

## 2020-10-05 ENCOUNTER — Encounter: Payer: Self-pay | Admitting: Gastroenterology

## 2020-10-06 LAB — SURGICAL PATHOLOGY

## 2021-10-04 ENCOUNTER — Other Ambulatory Visit: Payer: Self-pay | Admitting: Primary Care

## 2021-10-04 DIAGNOSIS — Z1231 Encounter for screening mammogram for malignant neoplasm of breast: Secondary | ICD-10-CM

## 2021-11-24 ENCOUNTER — Ambulatory Visit
Admission: RE | Admit: 2021-11-24 | Discharge: 2021-11-24 | Disposition: A | Discharge status: Home / Self Care | Payer: Medicare PPO | Source: Ambulatory Visit | Attending: Primary Care | Admitting: Primary Care

## 2021-11-24 ENCOUNTER — Other Ambulatory Visit: Payer: Self-pay

## 2021-11-24 DIAGNOSIS — Z1231 Encounter for screening mammogram for malignant neoplasm of breast: Secondary | ICD-10-CM | POA: Diagnosis not present

## 2022-06-06 IMAGING — MG MM DIGITAL SCREENING BILAT W/ TOMO AND CAD
6 of 10 series · 6 of 30 positions shown · non-contrast
Comparison: Previous exam(s).

CLINICAL DATA: Screening.

EXAM:
DIGITAL SCREENING BILATERAL MAMMOGRAM WITH TOMOSYNTHESIS AND CAD
TECHNIQUE: Bilateral screening digital craniocaudal and mediolateral oblique
mammograms were obtained. Bilateral screening digital breast
tomosynthesis was performed. The images were evaluated with
computer-aided detection.

[L MLO synth-2D (1 of 2)]
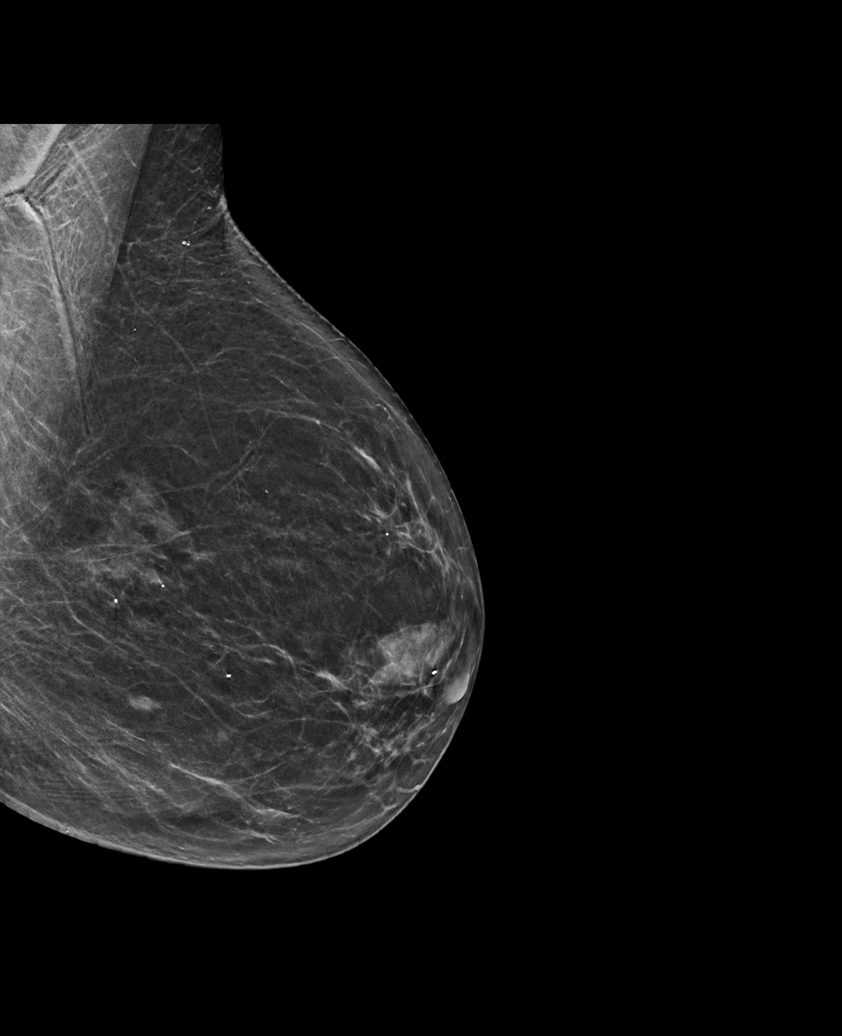

[R MLO synth-2D]
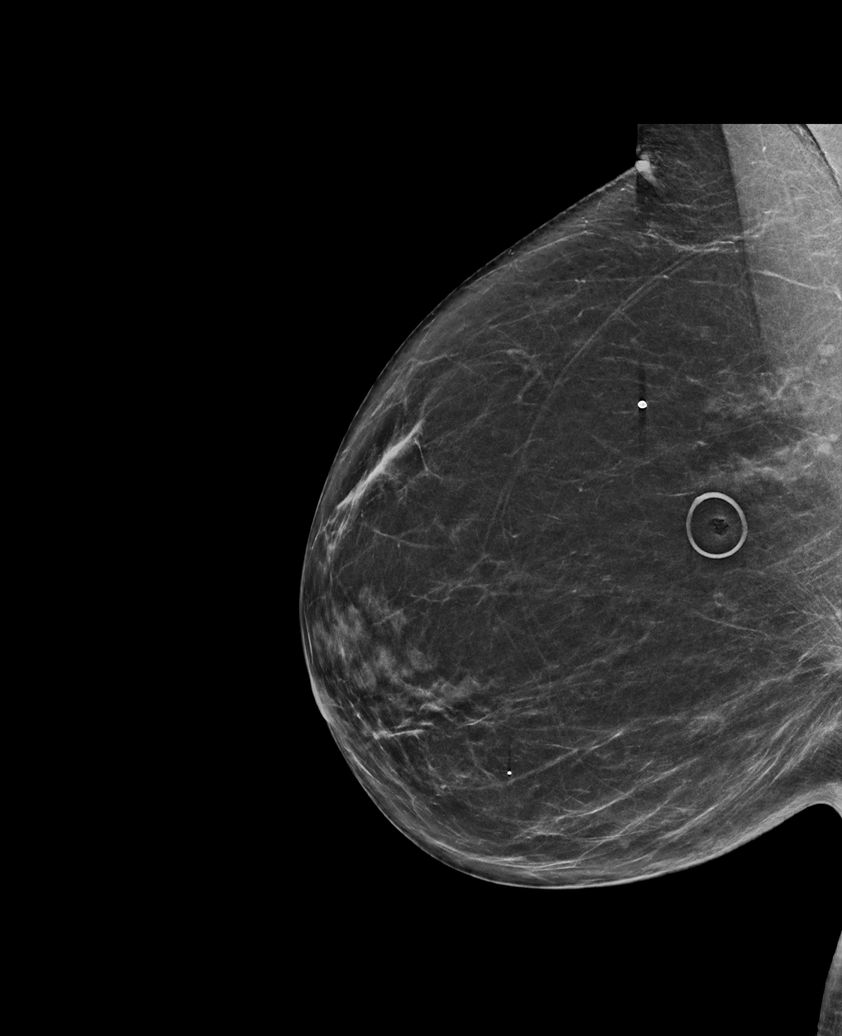

[L MLO synth-2D (2 of 2)]
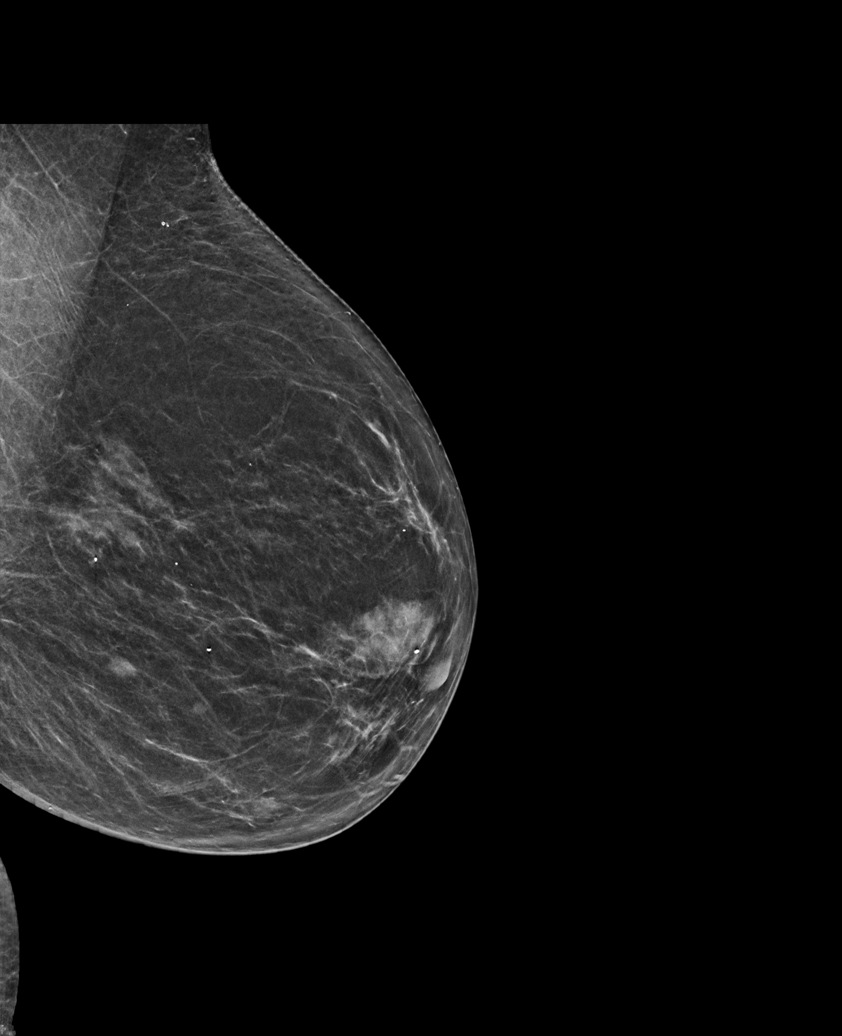

[L CC synth-2D]
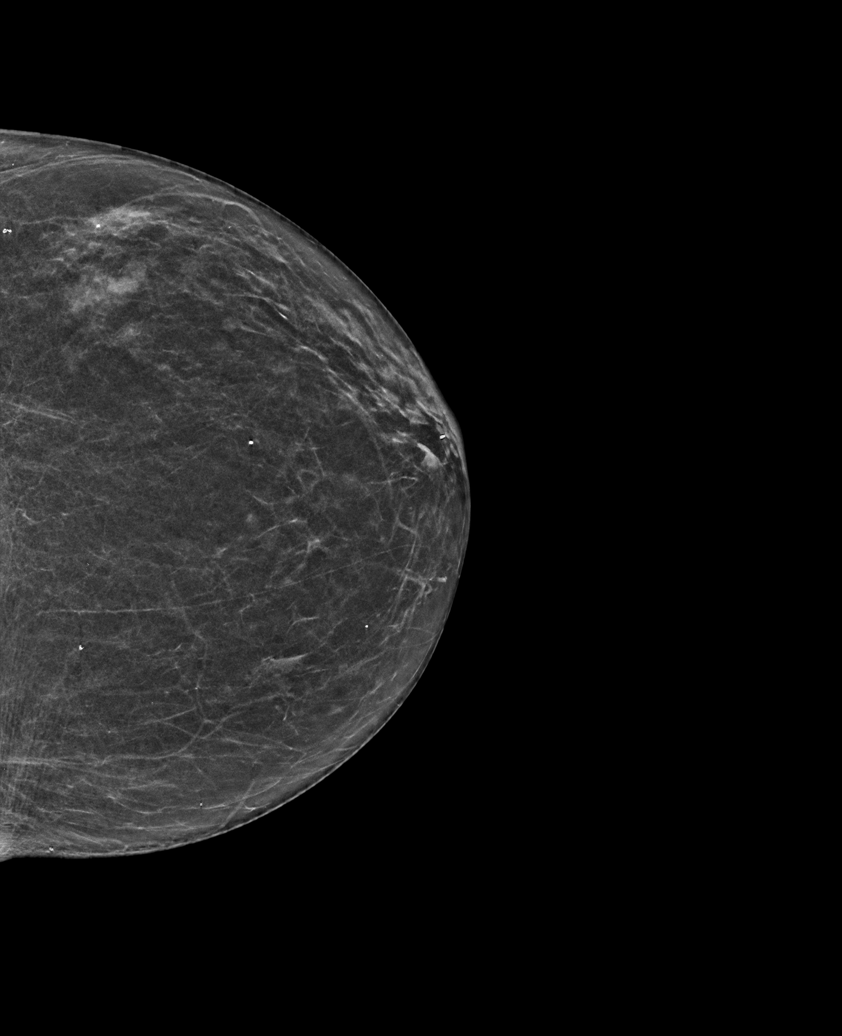

[R CC synth-2D]
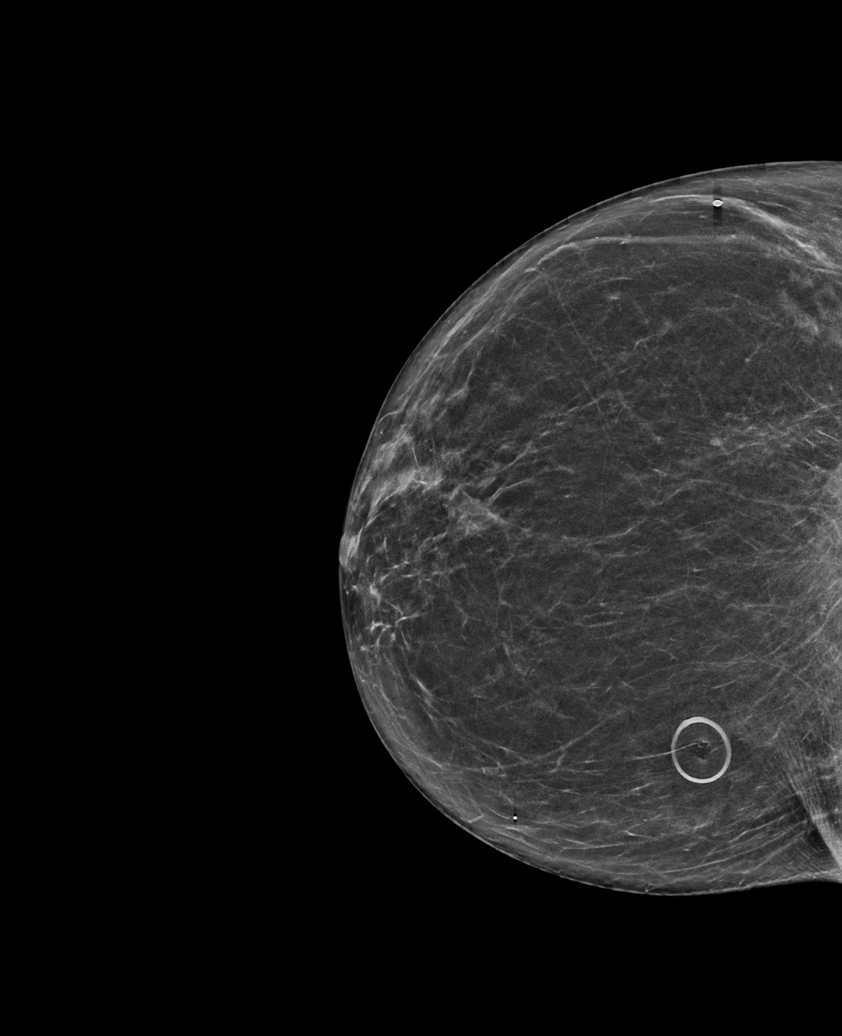

[L CC tomo · tomo slice 27/54.0]
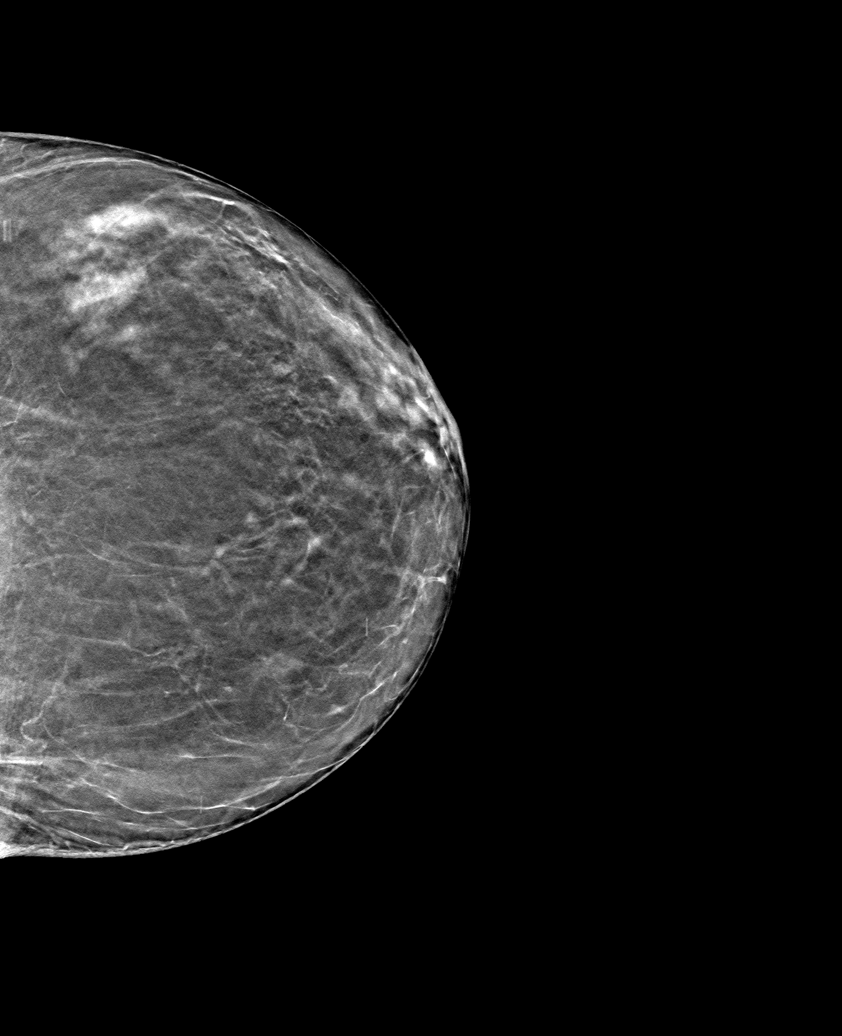

[6 of 30 positions shown; findings below may reference images not displayed]

ACR Breast Density Category b: There are scattered areas of
fibroglandular density.
FINDINGS: There are no findings suspicious for malignancy.
IMPRESSION: No mammographic evidence of malignancy. A result letter of this
screening mammogram will be mailed directly to the patient.

RECOMMENDATION:
Screening mammogram in one year. (Code:51-O-LD2)

BI-RADS CATEGORY  1: Negative.

## 2022-11-03 ENCOUNTER — Other Ambulatory Visit: Payer: Self-pay | Admitting: Family Medicine

## 2022-11-03 DIAGNOSIS — Z1231 Encounter for screening mammogram for malignant neoplasm of breast: Secondary | ICD-10-CM

## 2022-11-25 ENCOUNTER — Ambulatory Visit
Admission: RE | Admit: 2022-11-25 | Discharge: 2022-11-25 | Disposition: A | Discharge status: Home / Self Care | Payer: Medicare PPO | Source: Ambulatory Visit | Attending: Family Medicine | Admitting: Family Medicine

## 2022-11-25 DIAGNOSIS — Z1231 Encounter for screening mammogram for malignant neoplasm of breast: Secondary | ICD-10-CM

## 2023-10-30 ENCOUNTER — Other Ambulatory Visit: Payer: Self-pay | Admitting: Family Medicine

## 2023-10-30 DIAGNOSIS — Z1231 Encounter for screening mammogram for malignant neoplasm of breast: Secondary | ICD-10-CM

## 2023-12-15 ENCOUNTER — Ambulatory Visit
Admission: RE | Admit: 2023-12-15 | Discharge: 2023-12-15 | Disposition: A | Discharge status: Home / Self Care | Payer: Medicare PPO | Source: Ambulatory Visit | Attending: Family Medicine | Admitting: Family Medicine

## 2023-12-15 DIAGNOSIS — Z1231 Encounter for screening mammogram for malignant neoplasm of breast: Secondary | ICD-10-CM | POA: Insufficient documentation

## 2024-03-14 ENCOUNTER — Ambulatory Visit: Payer: BC Managed Care – PPO | Admitting: Dermatology

## 2024-04-15 ENCOUNTER — Ambulatory Visit: Admitting: Dermatology

## 2024-10-30 ENCOUNTER — Other Ambulatory Visit: Payer: Self-pay | Admitting: Family Medicine

## 2024-10-30 DIAGNOSIS — Z1231 Encounter for screening mammogram for malignant neoplasm of breast: Secondary | ICD-10-CM
